# Patient Record
Sex: Female | Born: 1950 | Race: White | Hispanic: No | Smoking: Never smoker
Health system: Southern US, Academic
[De-identification: ages and names within clinical notes are randomized; demographics above are authoritative.]

## PROBLEM LIST (undated history)

## (undated) DIAGNOSIS — M129 Arthropathy, unspecified: Secondary | ICD-10-CM

## (undated) DIAGNOSIS — F0280 Dementia in other diseases classified elsewhere without behavioral disturbance: Secondary | ICD-10-CM

## (undated) DIAGNOSIS — E039 Hypothyroidism, unspecified: Secondary | ICD-10-CM

## (undated) DIAGNOSIS — I10 Essential (primary) hypertension: Secondary | ICD-10-CM

## (undated) DIAGNOSIS — I8390 Asymptomatic varicose veins of unspecified lower extremity: Secondary | ICD-10-CM

## (undated) DIAGNOSIS — E782 Mixed hyperlipidemia: Secondary | ICD-10-CM

## (undated) DIAGNOSIS — G309 Alzheimer's disease, unspecified: Secondary | ICD-10-CM

## (undated) DIAGNOSIS — J449 Chronic obstructive pulmonary disease, unspecified: Secondary | ICD-10-CM

## (undated) HISTORY — PX: BLADDER SURGERY: SHX569

## (undated) HISTORY — PX: HX HYSTERECTOMY: SHX81

## (undated) HISTORY — PX: GALLBLADDER SURGERY: SHX652

## (undated) HISTORY — PX: FOOT SURGERY: SHX648

## (undated) HISTORY — PX: CATARACT EXTRACTION: SUR2

## (undated) HISTORY — DX: Alzheimer's disease, unspecified (CMS HCC): G30.9

## (undated) HISTORY — PX: HX CYST REMOVAL: SHX22

---

## 2018-06-28 ENCOUNTER — Inpatient Hospital Stay (HOSPITAL_COMMUNITY): Payer: Self-pay | Admitting: Internal Medicine

## 2020-09-23 IMAGING — US ABD LIMITED
1 series · 14 of 25 positions shown · non-contrast
Comparison: None.

﻿EXAM: [facility] PROFESSIONAL READ ABD U/S LMTD
INDICATION: D69.6.

[Series 1: abd limited · 14 of 46 slices shown]
[im 1/46]
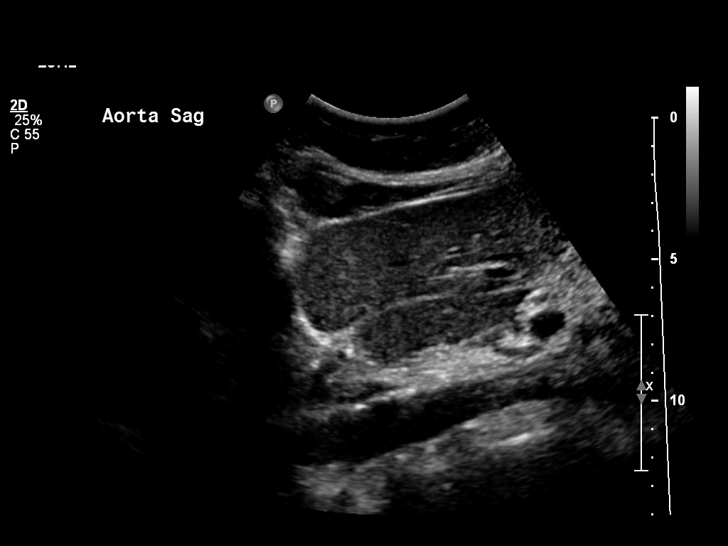
[im 4/46]
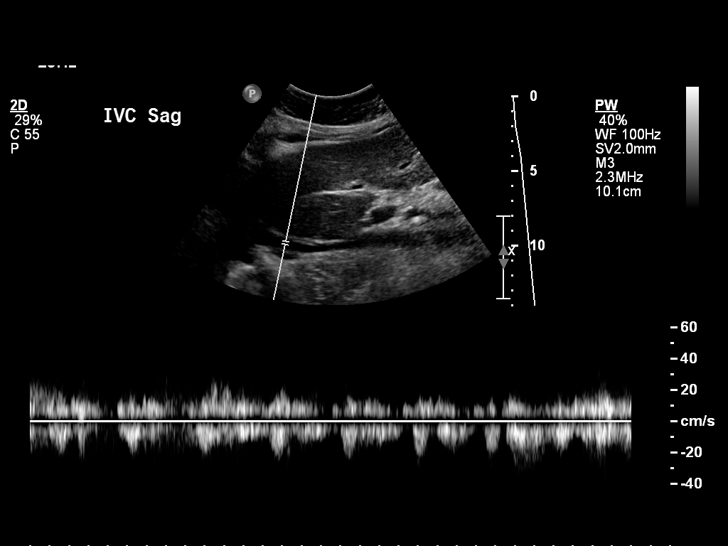
[im 8/46]
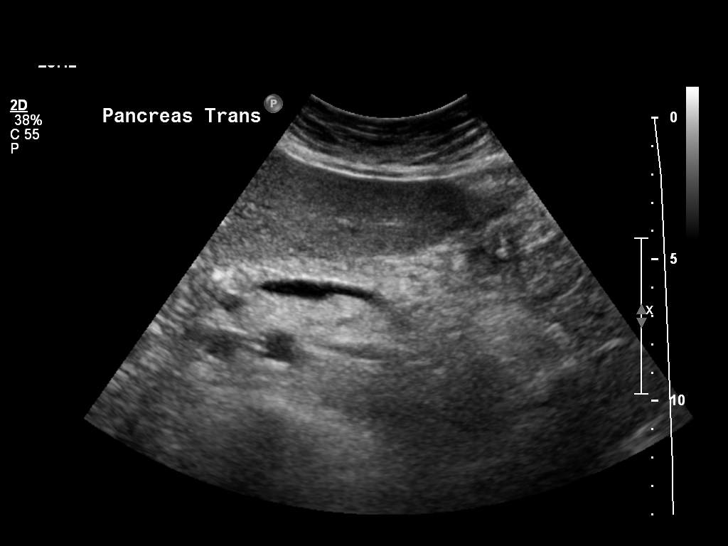
[im 12/46]
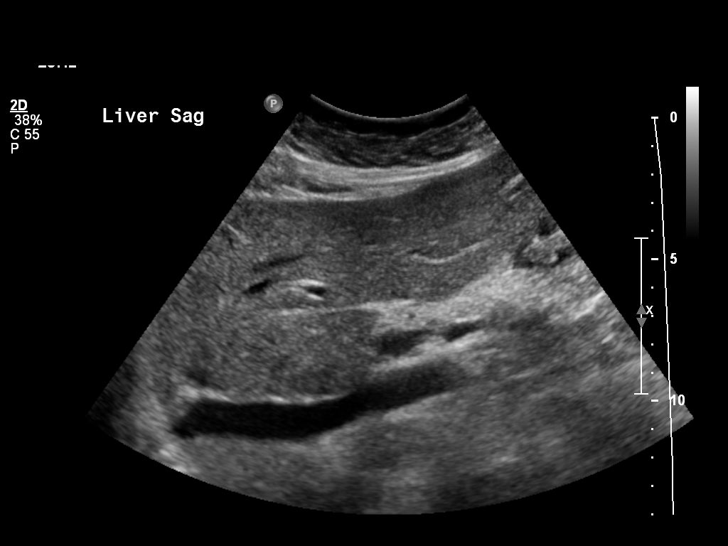
[im 16/46]
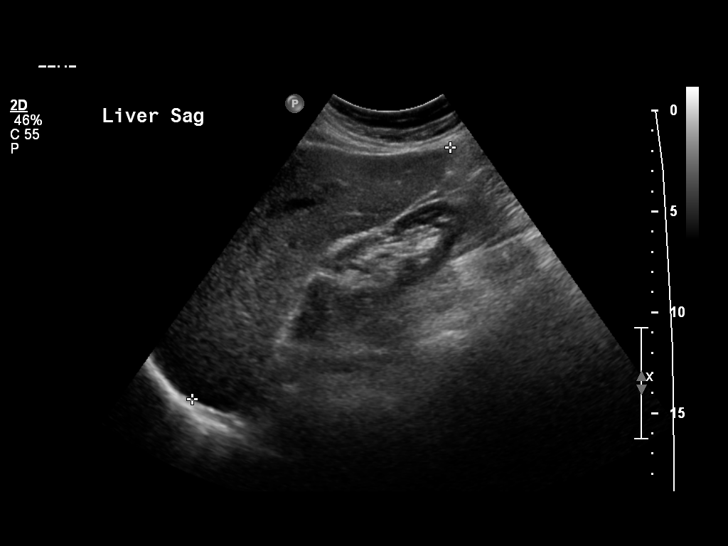
[im 17/46]
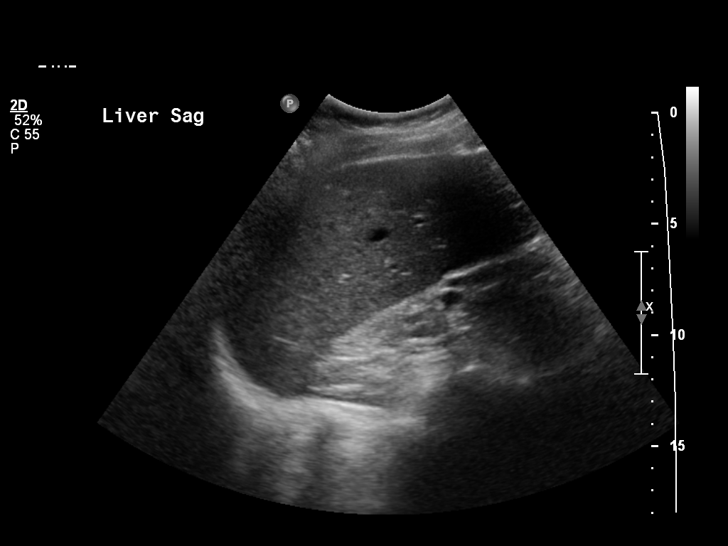
[im 21/46]
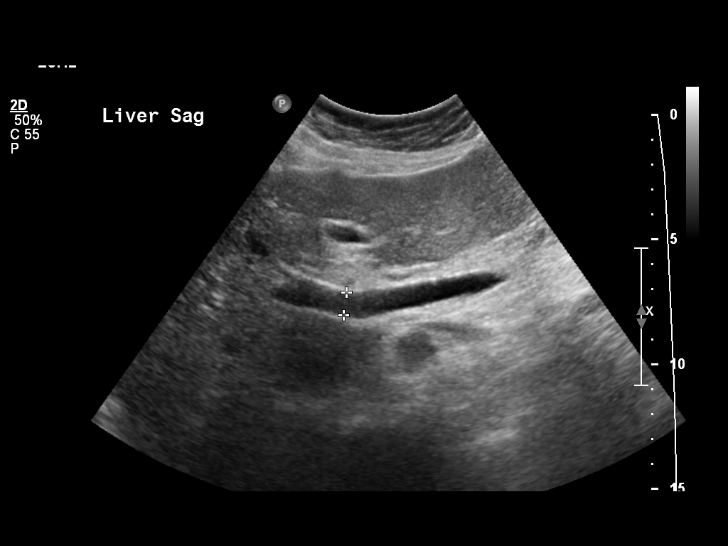
[im 25/46]
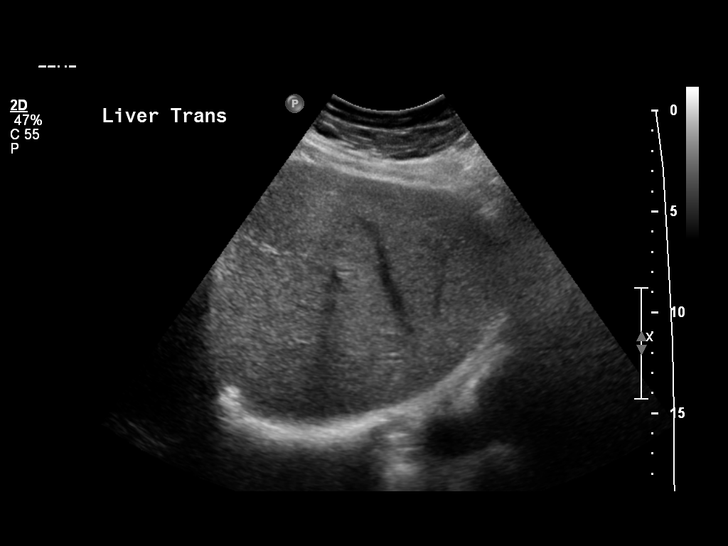
[im 29/46]
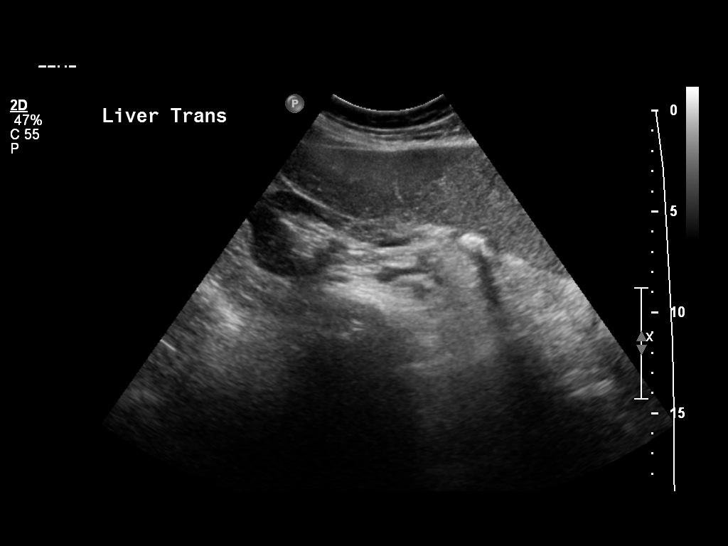
[im 31/46]
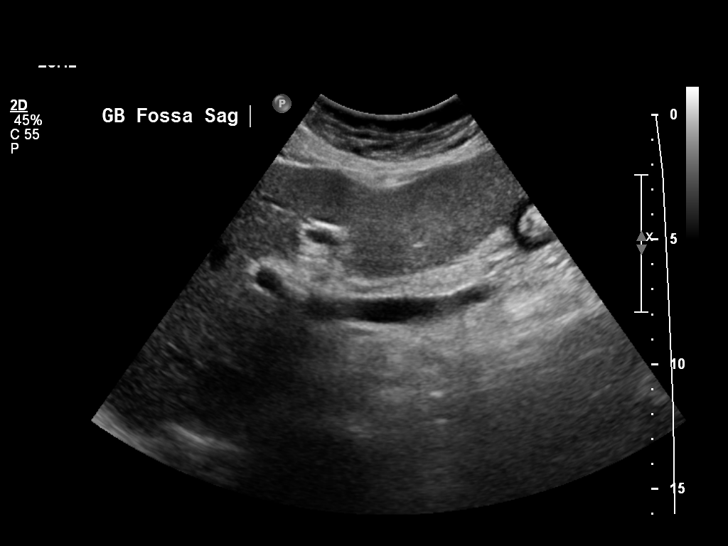
[im 34/46]
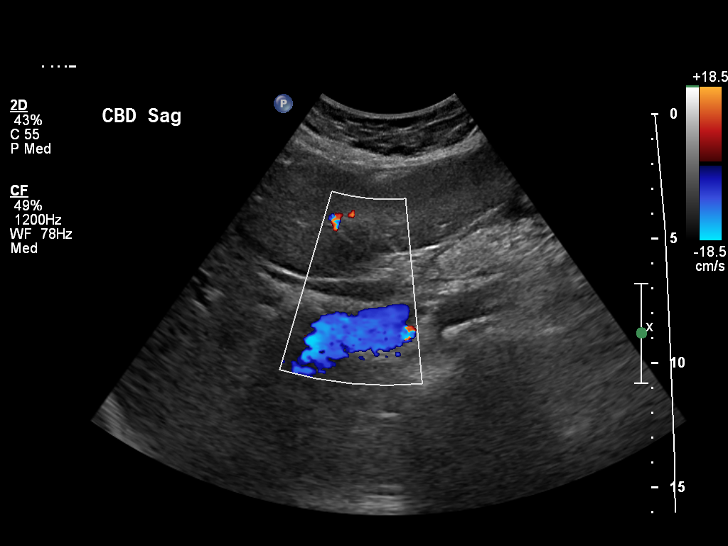
[im 38/46]
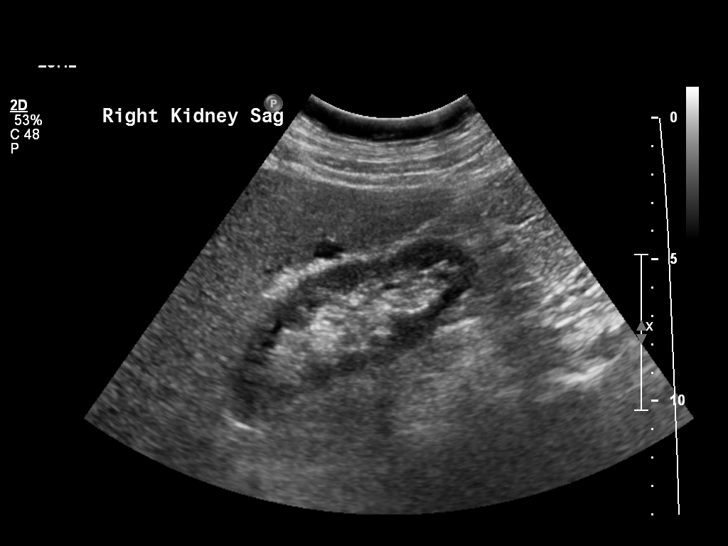
[im 42/46]
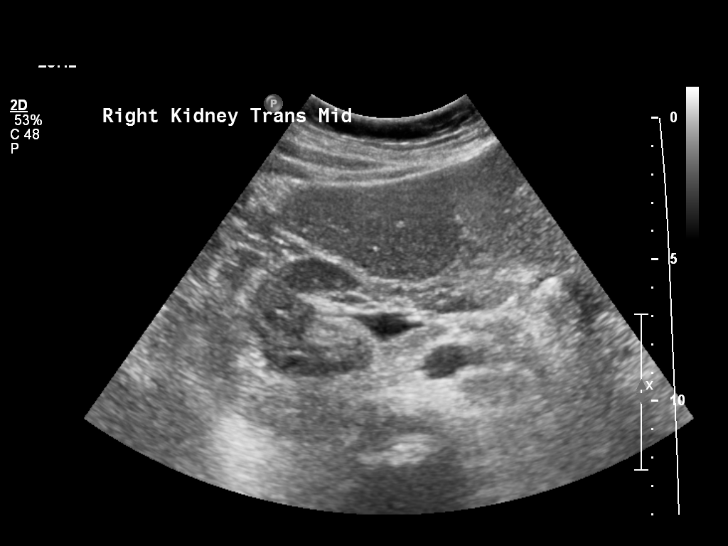
[im 46/46]
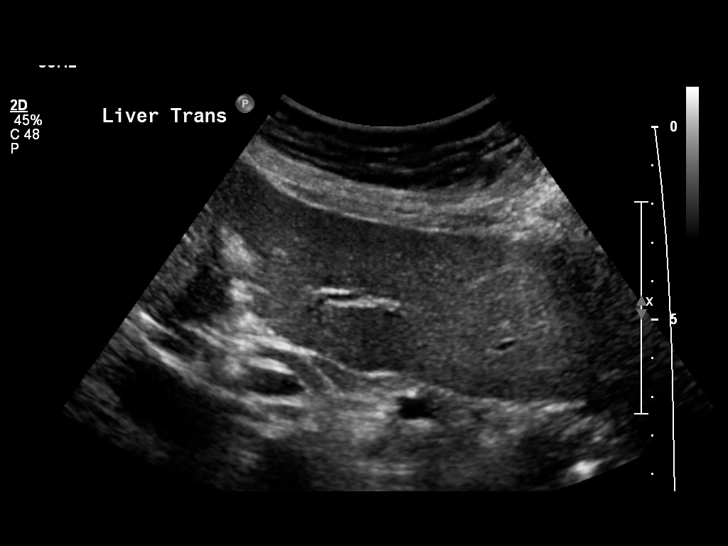

[14 of 25 positions shown; findings below may reference images not displayed]

FINDINGS: Liver is heterogeneous and borderline enlarged measuring 18 cm in maximum sagittal dimension. There is a 10 mm inferior right hepatic lobe cyst. There is no intra or extrahepatic biliary ductal dilation. Common bile duct measures 7 mm. Gallbladder is surgically absent. Pancreas is incompletely visualized due to artifact from overlying bowel gas. Right kidney measures 10 cm and is unremarkable. 

Visualized abdominal aorta is without aneurysmal dilatation. IVC is normal. Portal vein measures 9 mm in diameter and demonstrates appropriate hepatopetal direction of flow. Hepatic veins are also patent. There is no ascites.
IMPRESSION: 1. Heterogeneous and borderline enlarged liver.

2. Prior cholecystectomy.

3. Pancreas incompletely visualized due to artifact from overlying bowel gas.

## 2021-08-13 IMAGING — MR MRI HIP RT W/O CONTRAST
5 of 7 series · 24 of 40 positions shown · IV contrast (gadolinium)
Comparison: None available.

﻿EXAM:  [id number]   MRI HIP RT W/O CONTRAST
INDICATION: Right hip pain.  No known injury.
TECHNIQUE: Multiplanar multisequential MRI of the pelvis and right hip joint was performed without gadolinium contrast.

[Series 13: T2 fat-sat · axial · right · 4.5mm · 0.94mm/px · z∈[-39,+106]mm · 6 of 30 slices shown (1 of 3)]
[im 1/30]
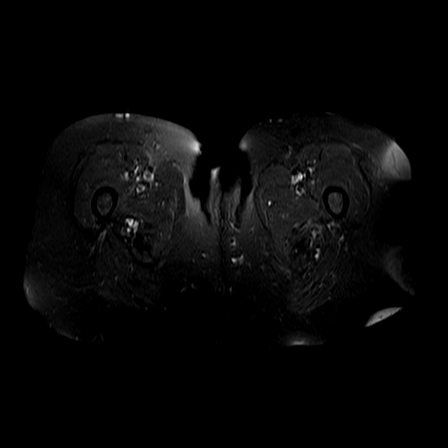
[im 6/30]
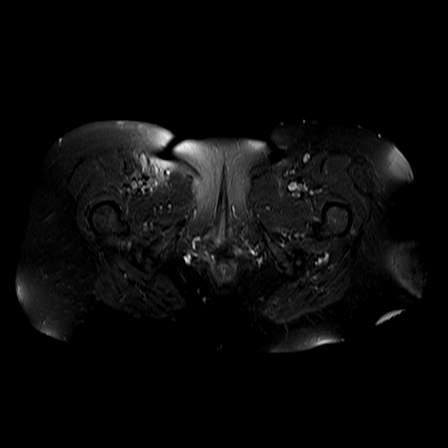
[im 12/30]
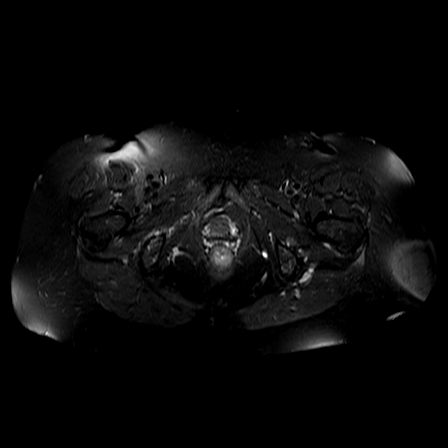
[im 18/30]
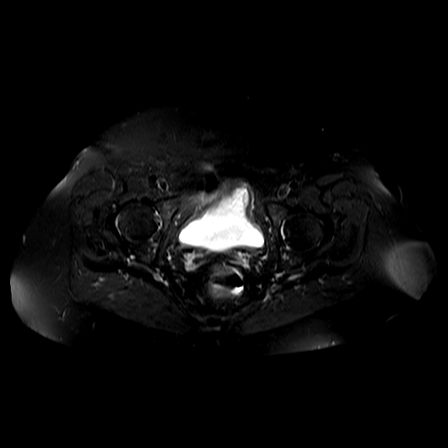
[im 24/30]
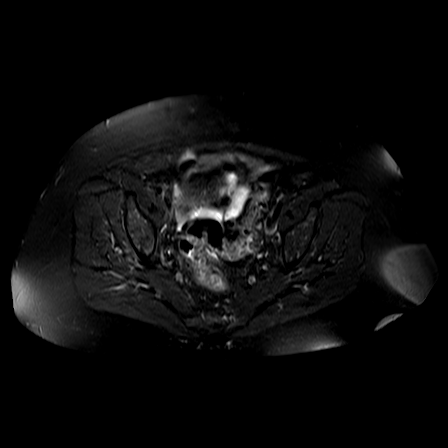
[im 30/30]
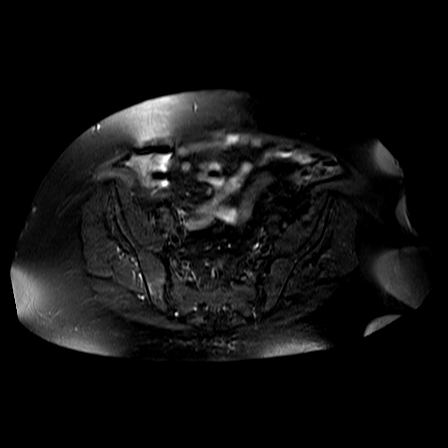

[Series 14: T1 · axial · right · 4.5mm · 0.82mm/px · z∈[-39,+106]mm · 7 of 30 slices shown (1 of 2)]
[im 1/30]
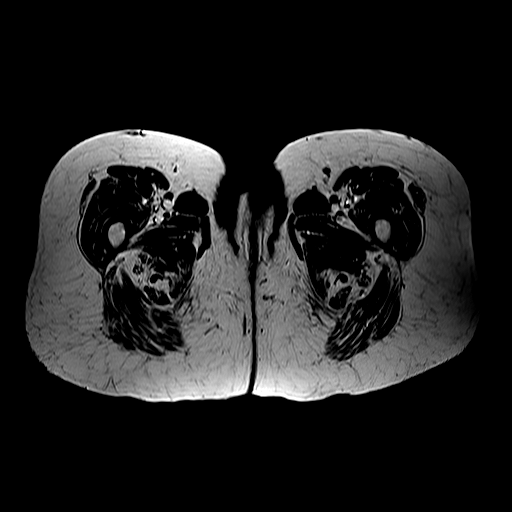
[im 5/30]
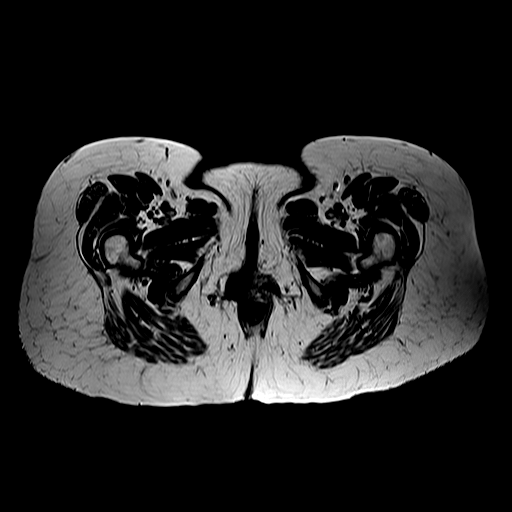
[im 10/30]
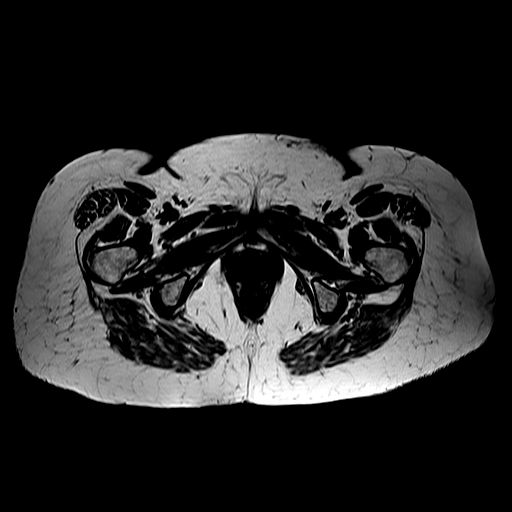
[im 15/30]
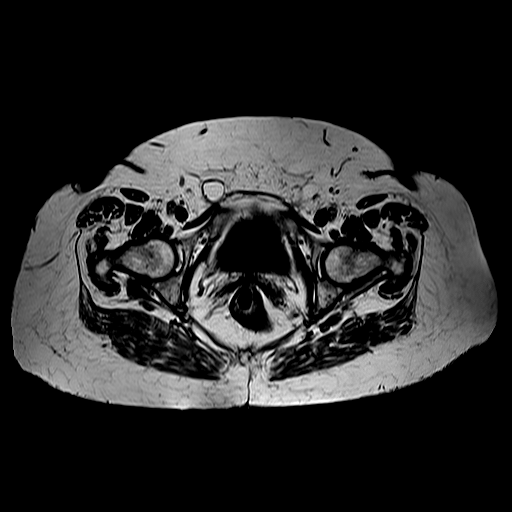
[im 20/30]
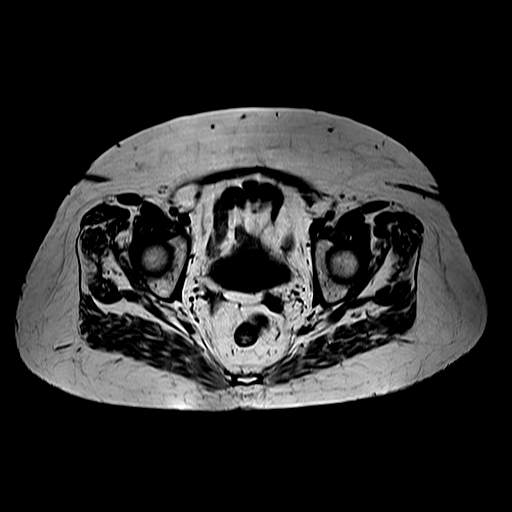
[im 25/30]
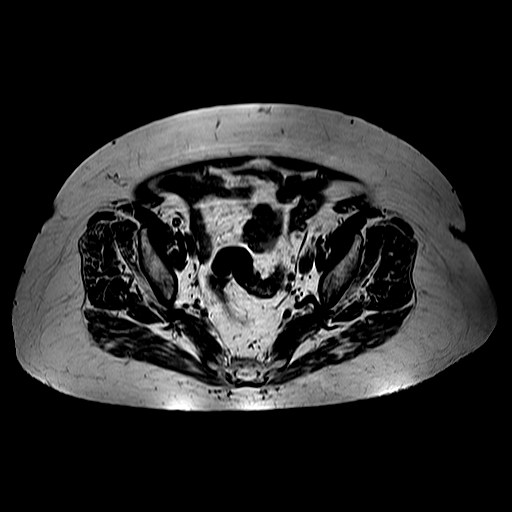
[im 30/30]
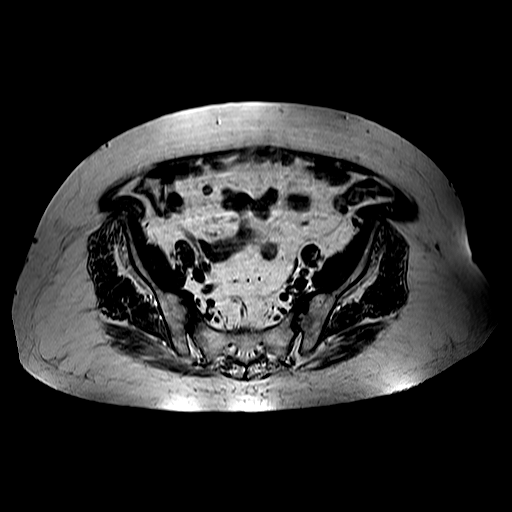

[Series 16: T1 · coronal · right · 4.0mm · 0.86mm/px · 1 of 20 slices shown (2 of 2)]
[im 1/20]
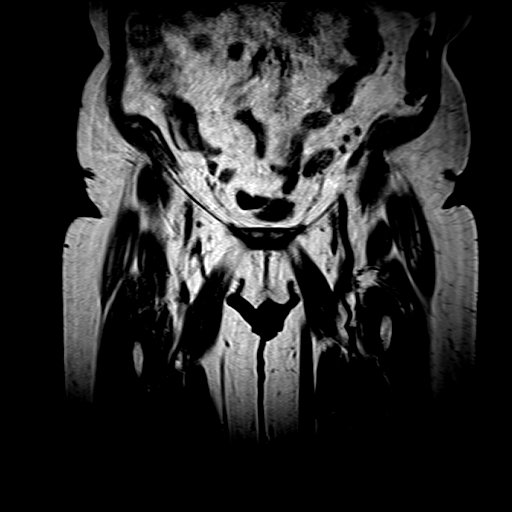

[Series 17: T2 fat-sat · coronal · right · 4.0mm · 0.86mm/px · 5 of 20 slices shown (2 of 3)]
[im 1/20]
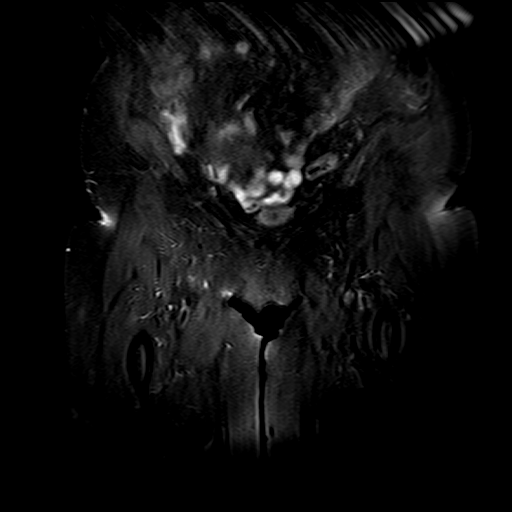
[im 5/20]
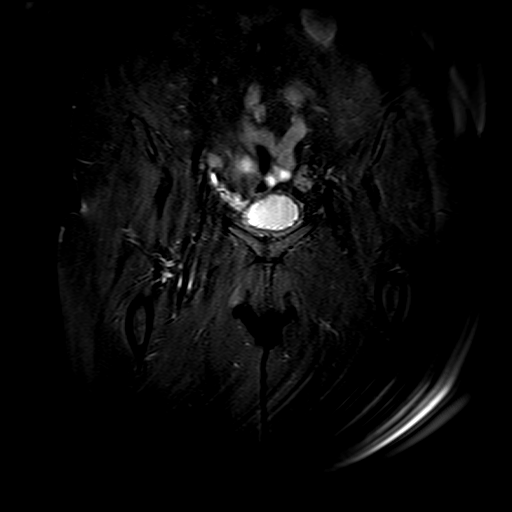
[im 10/20]
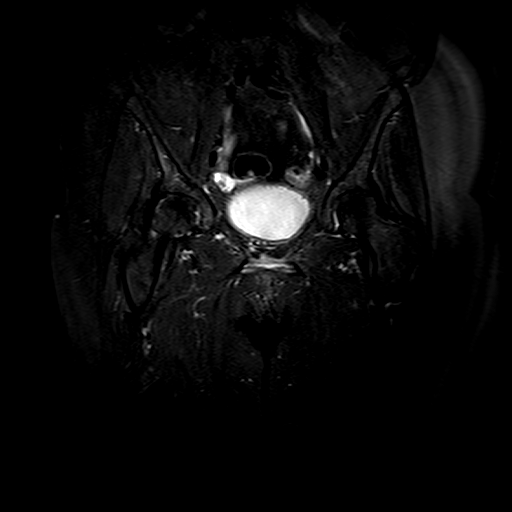
[im 15/20]
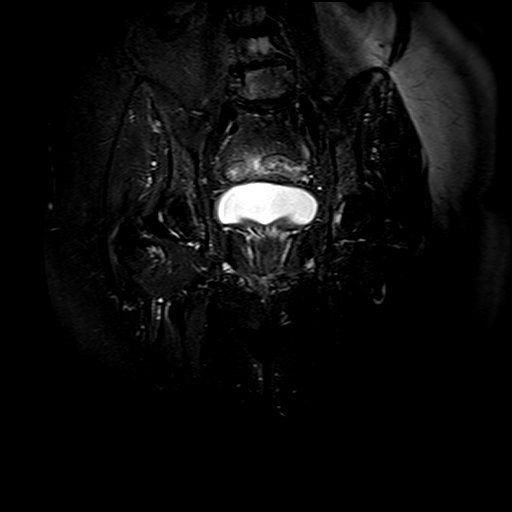
[im 20/20]
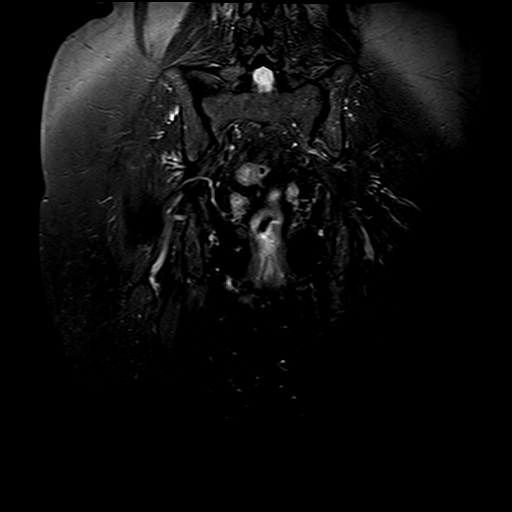

[Series 19: T2 fat-sat · sagittal · right · 4.5mm · 0.78mm/px · 5 of 20 slices shown (3 of 3)]
[im 1/20]
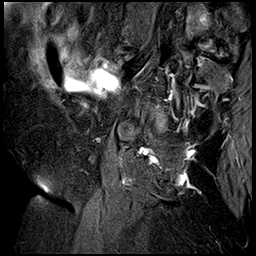
[im 5/20]
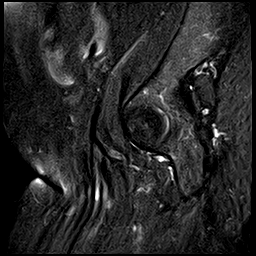
[im 10/20]
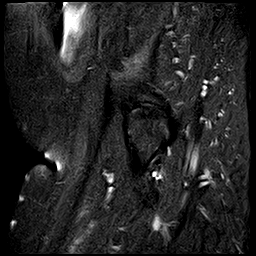
[im 15/20]
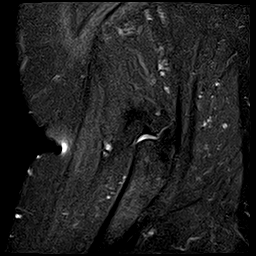
[im 20/20]
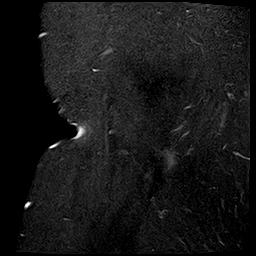

[24 of 40 positions shown; findings below may reference images not displayed]

FINDINGS: Bone marrow signal intensity is normal.  There is no acute fracture or subluxation.  There is mild right hip articular cartilage thinning.  A trace right hip effusion is also seen.  No significant degenerative changes are seen within the femur and acetabulum.  There is also mild right sacroiliac joint osteoarthritis. There is mild left greater trochanteric bursitis.  Muscles and soft tissues about the pelvic girdle appear unremarkable. Evaluation of pelvic parenchymal structures is limited without definite abnormality.
IMPRESSION: 1. Mild right hip and right sacroiliac joint osteoarthritis. 

2. Mild left greater trochanteric bursitis.

## 2021-08-13 IMAGING — MR MRI LUMBAR SPINE WITHOUT AND WITH CONTRAST
9 series · 48 of 48 positions shown · IV contrast (gadavist)
Comparison: None available.

﻿EXAM:  [id number]   MRI LUMBAR SPINE WITHOUT AND WITH CONTRAST
INDICATION: Lower back pain. No history of prior surgery or malignancy.
TECHNIQUE: Multiplanar multisequential MRI of the lumbar spine was performed without and with 5 mL of Gadavist.

[Series 5: T2 · sagittal · 4.0mm · 0.80mm/px · 3 of 13 slices shown (1 of 3)]
[im 1/13]
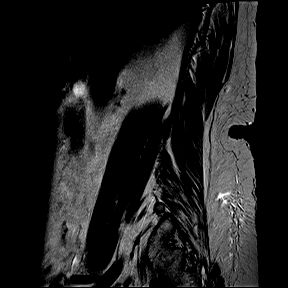
[im 7/13]
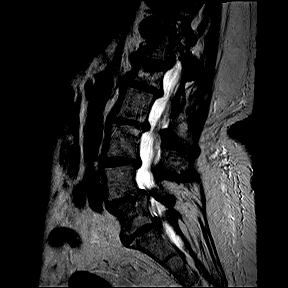
[im 13/13]
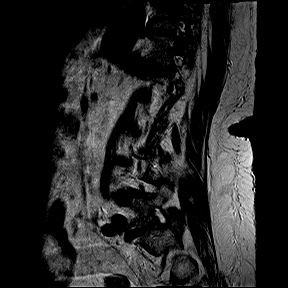

[Series 6: T1 · sagittal · 4.0mm · 0.80mm/px · 3 of 13 slices shown]
[im 1/13]
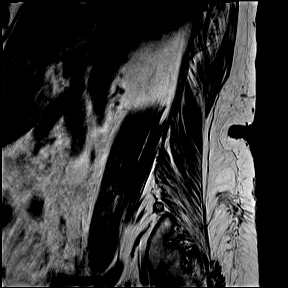
[im 7/13]
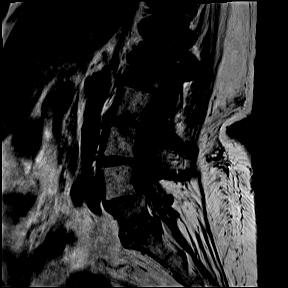
[im 13/13]
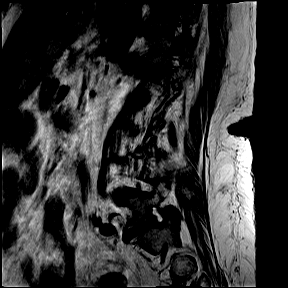

[Series 7: T1 fat-sat · sagittal · 4.0mm · 0.90mm/px · 4 of 13 slices shown (1 of 2)]
[im 1/13]
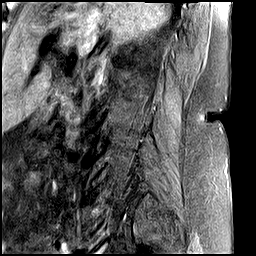
[im 5/13]
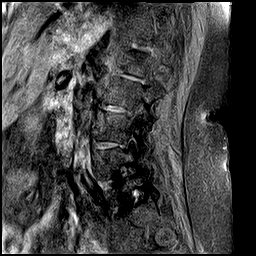
[im 9/13]
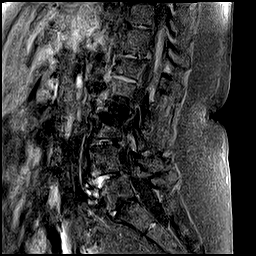
[im 13/13]
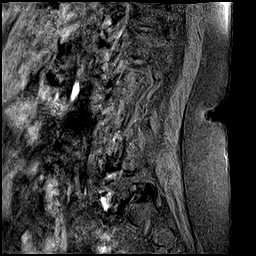

[Series 8: STIR · sagittal · 4.0mm · 0.90mm/px · 4 of 13 slices shown]
[im 1/13]
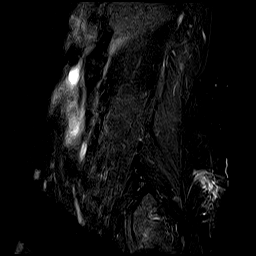
[im 5/13]
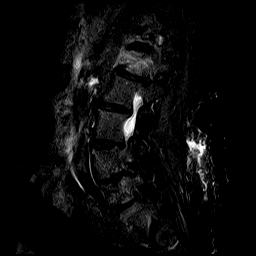
[im 9/13]
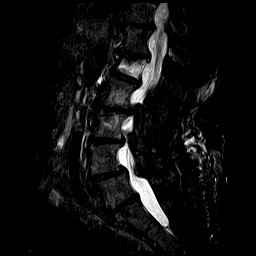
[im 13/13]
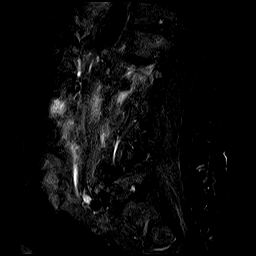

[Series 9: T2 · axial · 4.0mm · 0.47mm/px · z∈[-89,+100]mm · 8 of 26 slices shown (2 of 3)]
[im 1/26]
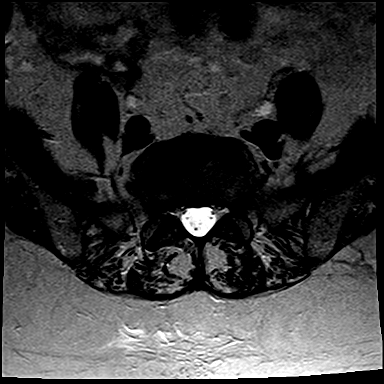
[im 4/26]
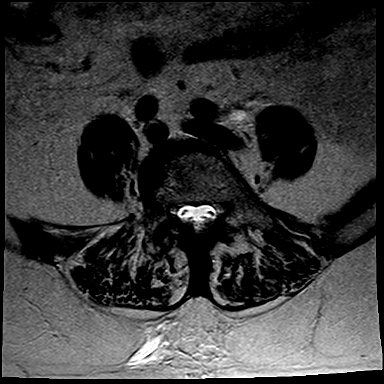
[im 8/26]
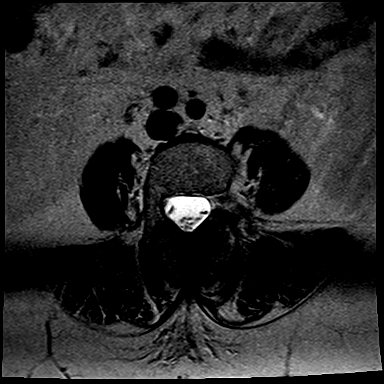
[im 11/26]
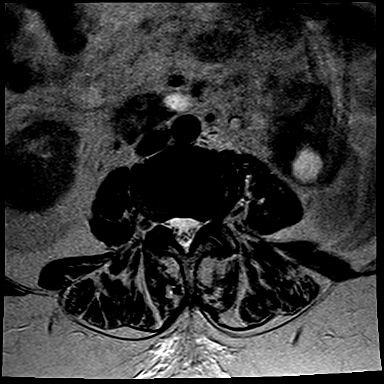
[im 15/26]
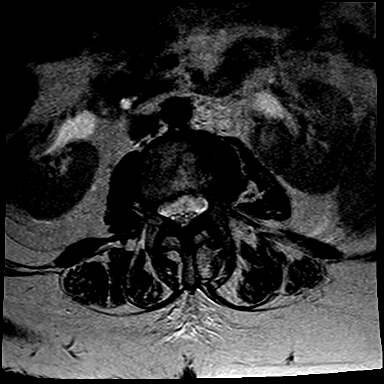
[im 18/26]
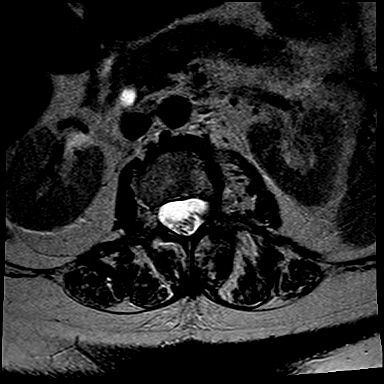
[im 22/26]
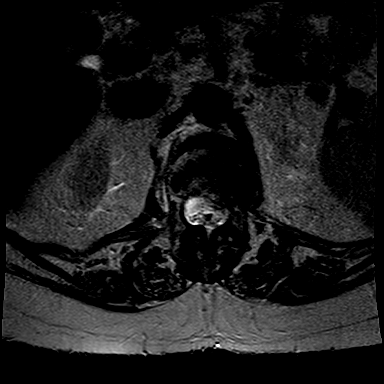
[im 26/26]
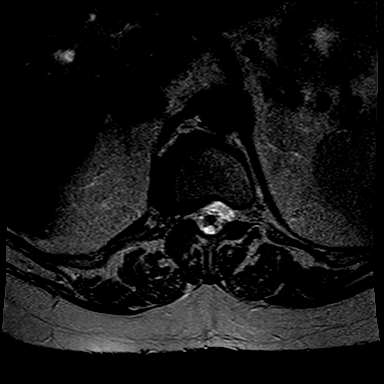

[Series 10: T1 fat-sat · axial · 4.0mm · 0.70mm/px · z∈[-89,+100]mm · 8 of 26 slices shown (2 of 2)]
[im 1/26]
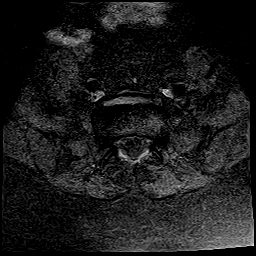
[im 4/26]
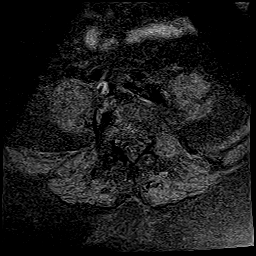
[im 8/26]
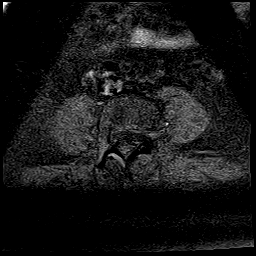
[im 11/26]
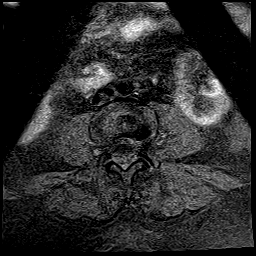
[im 15/26]
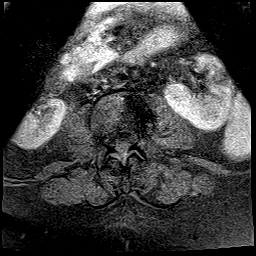
[im 18/26]
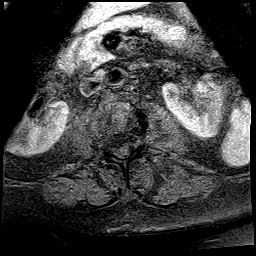
[im 22/26]
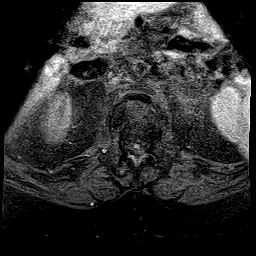
[im 26/26]
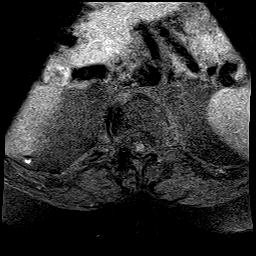

[Series 11: T2 · coronal · 4.0mm · 0.90mm/px · 6 of 20 slices shown (3 of 3)]
[im 1/20]
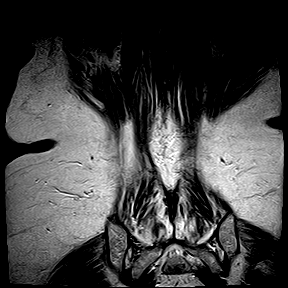
[im 4/20]
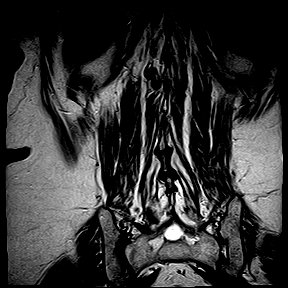
[im 8/20]
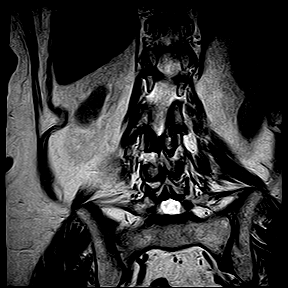
[im 12/20]
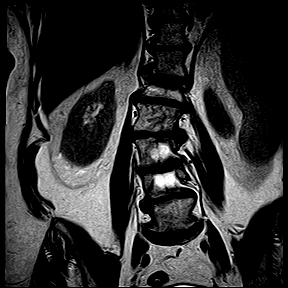
[im 16/20]
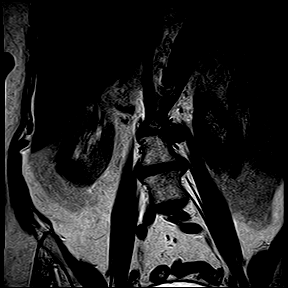
[im 20/20]
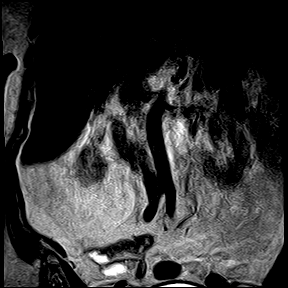

[Series 12: T1 fat-sat post-contrast · sagittal · 4.0mm · 0.90mm/px · 4 of 13 slices shown (1 of 2)]
[im 1/13]
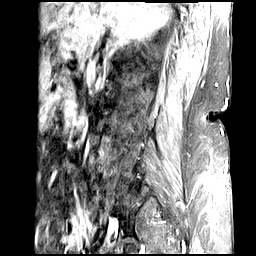
[im 5/13]
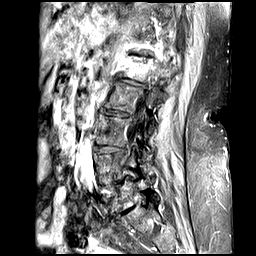
[im 9/13]
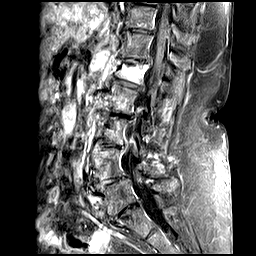
[im 13/13]
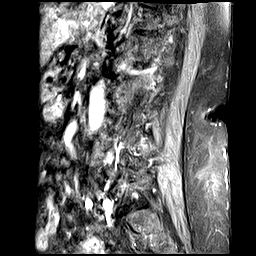

[Series 13: T1 fat-sat post-contrast · axial · 4.0mm · 0.70mm/px · z∈[-89,+100]mm · 8 of 26 slices shown (2 of 2)]
[im 1/26]
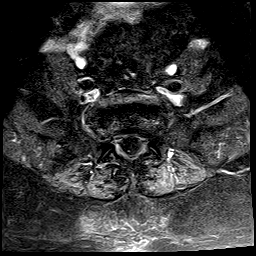
[im 4/26]
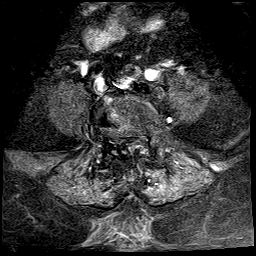
[im 8/26]
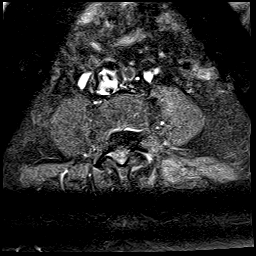
[im 11/26]
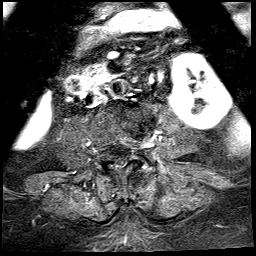
[im 15/26]
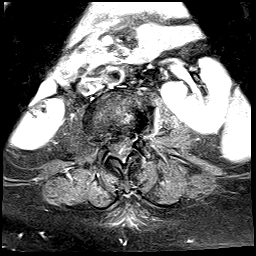
[im 18/26]
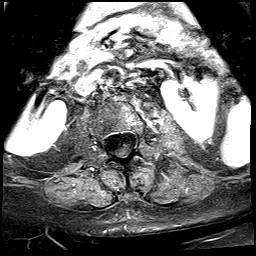
[im 22/26]
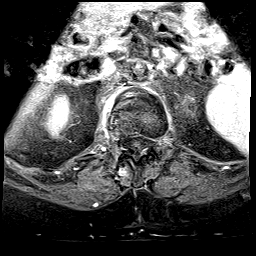
[im 26/26]
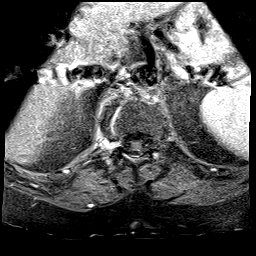

[48 of 48 positions shown; findings below may reference images not displayed]

FINDINGS: There is a mild dextroscoliosis centered at L2-3 disc space level.  There is also a mild subacute L1 superior endplate compression fracture. Distal spinal cord is normal in signal intensity and terminates normally at T12-L1 disc space level. Spinal canal is congenitally narrow.

At T12-L1 level, there is a small broad-based central disc bulge mildly effacing the ventral thecal sac. There is moderate right neural foraminal stenosis from facet arthropathy and bulging annulus. 

At L1-2 level, there is a small broad-based central disc bulge mildly effacing the ventral thecal sac. There is moderate left and mild right neural foraminal stenosis from facet arthropathy and bulging annulus.

At L2-3 level, there is a small broad-based central and left paracentral disc bulge resulting in moderate spinal and severe left lateral recess compression. There is severe left neural foraminal stenosis from facet arthropathy and bulging annulus. 

At L3-4 level, there is a small broad-based central disc bulge mildly effacing the ventral thecal sac. There is moderate to severe left and mild-to-moderate right neural foraminal stenosis from facet arthropathy and bulging annulus. 

At L4-5 level, there is grade 1 anterolisthesis of L4 on L5 vertebral body. There is a small broad-based central disc bulge resulting in severe spinal stenosis. There is moderate bilateral neural foraminal stenosis from facet arthropathy and bulging annulus. 

At L5-S1 level, there is marked disc desiccation. There is a small broad-based central disc bulge mildly effacing the ventral thecal sac. There is moderate to severe right and mild-to-moderate left neural foraminal stenosis from facet arthropathy and bulging annulus. 

Paraspinal soft tissues are unremarkable.  Following intravenous contrast administration, there is no abnormal nerve root or paraspinal soft tissue enhancement.
IMPRESSION: 1. Mild subacute L1 superior endplate compression fracture.  

2. Mild dextroscoliosis centered at L2-3 disc space level and grade 1 anterolisthesis of L4 on L5 vertebral body.  

3. Severe left lateral recess and neural foraminal stenosis at L2-3 level from a small central and left paracentral disc bulge.  

4. Severe spinal stenosis at L4-5 level from a small central disc bulge.  

5. Multilevel neural foraminal stenosis as detailed above.

## 2021-09-11 ENCOUNTER — Other Ambulatory Visit (HOSPITAL_COMMUNITY): Payer: Self-pay | Admitting: Family Medicine

## 2021-09-11 DIAGNOSIS — S72001A Fracture of unspecified part of neck of right femur, initial encounter for closed fracture: Secondary | ICD-10-CM

## 2021-09-11 DIAGNOSIS — S32009A Unspecified fracture of unspecified lumbar vertebra, initial encounter for closed fracture: Secondary | ICD-10-CM

## 2021-09-15 ENCOUNTER — Other Ambulatory Visit: Payer: Self-pay

## 2021-09-15 ENCOUNTER — Inpatient Hospital Stay
Admission: RE | Admit: 2021-09-15 | Discharge: 2021-09-15 | Disposition: A | Discharge status: Home / Self Care | Payer: Medicare Other | Source: Ambulatory Visit | Attending: Family Medicine | Admitting: Family Medicine

## 2021-09-15 DIAGNOSIS — S72001A Fracture of unspecified part of neck of right femur, initial encounter for closed fracture: Secondary | ICD-10-CM | POA: Insufficient documentation

## 2021-09-15 DIAGNOSIS — M1611 Unilateral primary osteoarthritis, right hip: Secondary | ICD-10-CM

## 2021-09-15 DIAGNOSIS — S32009A Unspecified fracture of unspecified lumbar vertebra, initial encounter for closed fracture: Secondary | ICD-10-CM | POA: Insufficient documentation

## 2021-09-15 MED ORDER — IOHEXOL 300 MG IODINE/ML INTRAVENOUS SOLUTION
5.0000 mL | INTRAVENOUS | Status: AC
Start: 2021-09-15 — End: 2021-09-15
  Administered 2021-09-15: 5 mL via INTRA_ARTICULAR

## 2021-09-15 MED ORDER — LIDOCAINE (PF) 20 MG/ML (2 %) INJECTION SOLUTION
INTRAMUSCULAR | Status: AC
Start: 2021-09-15 — End: 2021-09-15
  Filled 2021-09-15: qty 10

## 2021-09-15 MED ORDER — TRIAMCINOLONE ACETONIDE 40 MG/ML SUSPENSION FOR INJECTION
INTRAMUSCULAR | Status: AC
Start: 2021-09-15 — End: 2021-09-15
  Filled 2021-09-15: qty 2

## 2021-09-15 MED ORDER — BUPIVACAINE (PF) 0.25 % (2.5 MG/ML) INJECTION SOLUTION
INTRAMUSCULAR | Status: AC
Start: 2021-09-15 — End: 2021-09-15
  Filled 2021-09-15: qty 10

## 2022-01-24 ENCOUNTER — Emergency Department (HOSPITAL_COMMUNITY): Payer: Medicare Other

## 2022-01-24 ENCOUNTER — Other Ambulatory Visit: Payer: Self-pay

## 2022-01-24 ENCOUNTER — Encounter (HOSPITAL_COMMUNITY): Payer: Self-pay

## 2022-01-24 ENCOUNTER — Emergency Department
Admission: EM | Admit: 2022-01-24 | Discharge: 2022-01-24 | Disposition: A | Discharge status: Home / Self Care | Payer: Medicare Other | Attending: Student in an Organized Health Care Education/Training Program | Admitting: Student in an Organized Health Care Education/Training Program

## 2022-01-24 DIAGNOSIS — R9431 Abnormal electrocardiogram [ECG] [EKG]: Secondary | ICD-10-CM | POA: Insufficient documentation

## 2022-01-24 DIAGNOSIS — R42 Dizziness and giddiness: Secondary | ICD-10-CM

## 2022-01-24 HISTORY — DX: Chronic obstructive pulmonary disease, unspecified: J44.9

## 2022-01-24 HISTORY — DX: Mixed hyperlipidemia: E78.2

## 2022-01-24 HISTORY — DX: Arthropathy, unspecified: M12.9

## 2022-01-24 HISTORY — DX: Essential (primary) hypertension: I10

## 2022-01-24 HISTORY — DX: Asymptomatic varicose veins of unspecified lower extremity: I83.90

## 2022-01-24 HISTORY — DX: Hypothyroidism, unspecified: E03.9

## 2022-01-24 HISTORY — DX: Dementia in other diseases classified elsewhere, unspecified severity, without behavioral disturbance, psychotic disturbance, mood disturbance, and anxiety: F02.80

## 2022-01-24 LAB — ECG 12 LEAD
Atrial Rate: 77 {beats}/min
Calculated P Axis: 65 degrees
Calculated R Axis: 75 degrees
PR Interval: 168 ms
QRS Duration: 72 ms
QTC Calculation: 452 ms
Ventricular rate: 77 {beats}/min

## 2022-01-24 LAB — TROPONIN-I
TROPONIN I: 5 ng/L (ref <15)
TROPONIN I: 5 ng/L (ref <15)
TROPONIN I: 5 ng/L (ref <15)

## 2022-01-24 LAB — CBC WITH DIFF
BASOPHIL #: 0 10*3/uL (ref 0.00–0.30)
BASOPHIL %: 1 % (ref 0–3)
EOSINOPHIL #: 0.1 10*3/uL (ref 0.00–0.80)
EOSINOPHIL %: 2 % (ref 0–7)
HCT: 40.5 % (ref 37.0–47.0)
HGB: 13.8 g/dL (ref 12.5–16.0)
LYMPHOCYTE #: 1.3 10*3/uL (ref 1.10–5.00)
LYMPHOCYTE %: 20 % — ABNORMAL LOW (ref 25–45)
MCH: 31.2 pg (ref 27.0–32.0)
MCHC: 34 g/dL (ref 32.0–36.0)
MCV: 91.7 fL (ref 78.0–99.0)
MONOCYTE #: 0.5 10*3/uL (ref 0.00–1.30)
MONOCYTE %: 7 % (ref 0–12)
MPV: 6.8 fL — ABNORMAL LOW (ref 7.4–10.4)
NEUTROPHIL #: 4.7 10*3/uL (ref 1.80–8.40)
NEUTROPHIL %: 70 % (ref 40–76)
PLATELETS: 142 10*3/uL (ref 140–440)
RBC: 4.42 10*6/uL (ref 4.20–5.40)
RDW: 14.6 % (ref 11.6–14.8)
WBC: 6.7 10*3/uL (ref 4.0–10.5)
WBCS UNCORRECTED: 6.7 10*3/uL

## 2022-01-24 LAB — URINALYSIS, MACROSCOPIC
BILIRUBIN: NEGATIVE mg/dL
BLOOD: NEGATIVE mg/dL
GLUCOSE: NEGATIVE mg/dL
KETONES: NEGATIVE mg/dL
LEUKOCYTES: NEGATIVE WBCs/uL
NITRITE: NEGATIVE
PH: 5.5 (ref 5.0–9.0)
PROTEIN: NEGATIVE mg/dL
SPECIFIC GRAVITY: 1.005 (ref 1.002–1.030)
UROBILINOGEN: NORMAL mg/dL

## 2022-01-24 LAB — PT/INR
INR: 1.03 (ref <=5.00)
PROTHROMBIN TIME: 11.9 seconds (ref 9.8–12.7)

## 2022-01-24 LAB — COMPREHENSIVE METABOLIC PANEL, NON-FASTING
ALBUMIN/GLOBULIN RATIO: 1.3 (ref 0.8–1.4)
ALBUMIN: 4.5 g/dL (ref 3.5–5.7)
ALKALINE PHOSPHATASE: 76 U/L (ref 34–104)
ALT (SGPT): 17 U/L (ref 7–52)
ANION GAP: 6 mmol/L (ref 4–13)
AST (SGOT): 25 U/L (ref 13–39)
BILIRUBIN TOTAL: 0.7 mg/dL (ref 0.3–1.2)
BUN/CREA RATIO: 11 (ref 6–22)
BUN: 9 mg/dL (ref 7–25)
CALCIUM, CORRECTED: 9.2 mg/dL (ref 8.9–10.8)
CALCIUM: 9.7 mg/dL (ref 8.6–10.3)
CHLORIDE: 104 mmol/L (ref 98–107)
CO2 TOTAL: 30 mmol/L (ref 21–31)
CREATININE: 0.81 mg/dL (ref 0.60–1.30)
ESTIMATED GFR: 77 mL/min/{1.73_m2} (ref >59)
GLOBULIN: 3.5 (ref 2.9–5.4)
GLUCOSE: 98 mg/dL (ref 74–109)
OSMOLALITY, CALCULATED: 278 mOsm/kg (ref 270–290)
POTASSIUM: 3.8 mmol/L (ref 3.5–5.1)
PROTEIN TOTAL: 8 g/dL (ref 6.4–8.9)
SODIUM: 140 mmol/L (ref 136–145)

## 2022-01-24 LAB — URINALYSIS, MICROSCOPIC
RBCS: <1 /hpf (ref <4)
WBCS: <1 /hpf (ref <6)

## 2022-01-24 LAB — MAGNESIUM: MAGNESIUM: 1.8 mg/dL — ABNORMAL LOW (ref 1.9–2.7)

## 2022-01-24 LAB — GRAY TOP TUBE

## 2022-01-24 LAB — PTT (PARTIAL THROMBOPLASTIN TIME): APTT: 31.2 seconds (ref 26.0–36.0)

## 2022-01-24 MED ORDER — MECLIZINE 12.5 MG TABLET
12.5000 mg | ORAL_TABLET | Freq: Two times a day (BID) | ORAL | 0 refills | Status: AC | PRN
Start: 2022-01-24 — End: 2022-02-03

## 2022-01-24 MED ORDER — HYDRALAZINE 20 MG/ML INJECTION SOLUTION
INTRAMUSCULAR | Status: AC
Start: 2022-01-24 — End: 2022-01-24
  Filled 2022-01-24: qty 1

## 2022-01-24 MED ORDER — HYDRALAZINE 20 MG/ML INJECTION SOLUTION
10.0000 mg | INTRAMUSCULAR | Status: AC
Start: 2022-01-24 — End: 2022-01-24
  Administered 2022-01-24: 10 mg via INTRAVENOUS

## 2022-01-24 MED ORDER — LACTATED RINGERS IV BOLUS
1000.0000 mL | INJECTION | Status: AC
Start: 2022-01-24 — End: 2022-01-24
  Administered 2022-01-24: 0 mL via INTRAVENOUS
  Administered 2022-01-24: 1000 mL via INTRAVENOUS

## 2022-01-24 MED ORDER — MECLIZINE 25 MG TABLET
25.0000 mg | ORAL_TABLET | ORAL | Status: AC
Start: 2022-01-24 — End: 2022-01-24
  Administered 2022-01-24: 25 mg via ORAL

## 2022-01-24 MED ORDER — MECLIZINE 25 MG TABLET
ORAL_TABLET | ORAL | Status: AC
Start: 2022-01-24 — End: 2022-01-24
  Filled 2022-01-24: qty 1

## 2022-01-24 NOTE — ED Nurses Note (Signed)
Patient discharged to home. A written copy of discharge instructions was given to the patient. Questions sufficiently answered as needed. Patient encouraged to follow up with PCP as indicated. IV removed, catheter intact. Pressure dressing applied and bleeding controlled. Patient left department via wheelchair.

## 2022-01-24 NOTE — Discharge Instructions (Signed)
Resume home medications as previously prescribed.  Take meclizine as directed.  Follow up with your PCP for close ED follow-up.  Tried to consume 64 oz of fluids per day.  Return emergency department for new or worsening symptoms that concern you.    Do keep a track record of your blood pressure if uncontrolled follow up with your PCP for further medication management.

## 2022-01-24 NOTE — ED Provider Notes (Signed)
[facility]  ED Primary Provider Note  History of Present Illness   Chief Complaint   Patient presents with    Dizziness     Taylor Levine is a 71 y.o. female who had concerns including Dizziness.  Arrival: The patient arrived by Car    Patient is a 71 year old arrives today via POV with Taylor Levine at bedside whom the patient lives with complaining of headache and dizziness.  Patient was by [facility] for CT of the head.  Patient reports she is had intermittent dizziness.  Patient reports dizziness is worsened with sudden movements.  Taylor Levine states that patient has had uncontrolled blood pressure for last few days.  Patient does endorse frontal headache but states it is similar to previous headaches.  Patient denies recent traumatic events, neuro focal deficits, vision changes, epistaxis, hemiparesis, slurred speech, chest pain, shortness of breath, abdominal pain    Review of Systems   Pertinent positive and negative ROS as per HPI.  Historical Data   History Reviewed This Encounter:      Physical Exam   ED Triage Vitals [01/24/22 1117]   BP (Non-Invasive) (!) 174/77   Heart Rate 76   Respiratory Rate 16   Temperature 36.1 C (96.9 F)   SpO2 95 %   Weight 69.4 kg (153 lb)   Height 1.549 m (_0 )     Physical Exam  Vitals and nursing note reviewed.   Constitutional:       Appearance: Normal appearance. She is normal weight.   HENT:      Head: Normocephalic and atraumatic.      Mouth/Throat:      Mouth: Mucous membranes are moist.   Eyes:      Pupils: Pupils are equal, round, and reactive to light.   Cardiovascular:      Rate and Rhythm: Normal rate and regular rhythm.      Pulses: Normal pulses.      Heart sounds: Normal heart sounds.   Pulmonary:      Effort: Pulmonary effort is normal.      Breath sounds: Normal breath sounds.   Abdominal:      General: Abdomen is flat. Bowel sounds are normal.      Palpations: Abdomen is soft.   Musculoskeletal:         General: Normal range  of motion.   Skin:     General: Skin is warm and dry.      Capillary Refill: Capillary refill takes less than 2 seconds.   Neurological:      General: No focal deficit present.      Mental Status: She is alert and oriented to person, place, and time. Mental status is at baseline.      GCS: GCS eye subscore is 4. GCS verbal subscore is 5. GCS motor subscore is 6.      Motor: Motor function is intact.      Coordination: Coordination is intact. Coordination normal. Finger-Nose-Finger Test normal.      Gait: Gait is intact.   Psychiatric:         Mood and Affect: Mood normal.         Behavior: Behavior normal.         Thought Content: Thought content normal.         Judgment: Judgment normal.     Patient Data     Labs Ordered/Reviewed   MAGNESIUM - Abnormal; Notable for the following components:  Result Value    MAGNESIUM 1.8 (*)     All other components within normal limits   CBC WITH DIFF - Abnormal; Notable for the following components:    MPV 6.8 (*)     LYMPHOCYTE % 20 (*)     All other components within normal limits   URINALYSIS, MICROSCOPIC - Abnormal; Notable for the following components:    MUCOUS Rare (*)     All other components within normal limits   COMPREHENSIVE METABOLIC PANEL, NON-FASTING - Normal    Narrative:     Estimated Glomerular Filtration Rate (eGFR) is calculated using the CKD-EPI (2021) equation, intended for patients 73 years of age and older. If gender is not documented or "unknown", there will be no eGFR calculation.   PT/INR - Normal    Narrative:     INR OF 2.0-3.0  RECOMMENDED FOR: PROPHYLAXIS/TREATMENT OF VENEOUS THROMBOSIS, PULMONARY EMBOLISM, PREVENTION OF SYSTEMIC EMBOLISM FROM ATRIAL FIBRILATION, MYOCARDIAL INFARCTION.    INR OF 2.5-3.5  RECOMMENDED FOR MECHANICAL PROSTHETIC HEART VALVES, RECURRENT SYSTEMIC EMBOLISM, RECURRENT MYOCARDIAL INFARCTION.     PTT (PARTIAL THROMBOPLASTIN TIME) - Normal   TROPONIN-I - Normal   URINALYSIS, MACROSCOPIC - Normal   TROPONIN-I - Normal    TROPONIN-I - Normal   CBC/DIFF    Narrative:     The following orders were created for panel order CBC/DIFF.  Procedure                               Abnormality         Status                     ---------                               -----------         ------                     CBC WITH CWCB[[id number]]                Abnormal            Final result                 Please view results for these tests on the individual orders.   URINALYSIS, MACROSCOPIC AND MICROSCOPIC W/CULTURE REFLEX    Narrative:     The following orders were created for panel order URINALYSIS, MACROSCOPIC AND MICROSCOPIC W/CULTURE REFLEX.  Procedure                               Abnormality         Status                     ---------                               -----------         ------                     URINALYSIS, MACROSCOPIC[[id number]]      Normal              Final result  URINALYSIS, MICROSCOPIC[[id number]]      Abnormal            Final result                 Please view results for these tests on the individual orders.     CT BRAIN WO IV CONTRAST   Final Result by Edi, [facility] In (08/20 1345)   CHRONIC CHANGES.  NO ACUTE FINDINGS.          One or more dose reduction techniques were used (e.g., Automated exposure control, adjustment of the mA and/or kV according to patient size, use of iterative reconstruction technique).         Radiologist location ID: [facility] Decision Making        Medical Decision Making  CT head unremarkable for any acute changes or concerns.  Orthostatic vital signs negative.  Workup grossly normal.  EKG showed normal sinus rhythm rate of 77 beats per minute PR interval 168 QTC 452.  No ST elevation or depression noted.  Patient did receive 1 L of fluids as well as meclizine states he does feel better at this time.  Patient was given hydralazine for her blood pressure.  Blood pressure stable upon discharge.  Patient given prescription for meclizine advised to follow up  with PCP for close ED follow-up.  All questions and concerns addressed at this time.    Amount and/or Complexity of Data Reviewed  Labs: ordered.  Radiology: ordered.  ECG/medicine tests: ordered.    Risk  Prescription drug management.             Medications Administered in the ED   LR bolus infusion 1,000 mL (0 mL Intravenous Stopped 01/24/22 1842)   meclizine (ANTIVERT) tablet (25 mg Oral Given 01/24/22 1756)   hydrALAZINE (APRESOLINE) injection 10 mg (10 mg Intravenous Given 01/24/22 1923)     Clinical Impression   Vertigo (Primary)       Disposition: Discharged

## 2022-01-24 NOTE — ED Triage Notes (Signed)
Dizziness since last night and being off balanced this am. Sent from [facility] for [facility] of the head. Frontal headache and states swelling to the right side of the face.

## 2022-01-24 NOTE — ED Nurses Note (Signed)
Patient to EDM via wheelchair with daughter. Patient states last night around 2030 she was standing up beside her bed and got really dizzy then got a headache. Patient states she checked her BP and glucose and both were fine. She states she took some Tylenol and went to bed. Patient states this morning she was still dizzy and had a slight headache so she went to [facility]. She states she was advised to come here for a possible head CT. Patient states she has had blurry vision, headache and dizziness today. Patient states current pain is 2/10. Patient placed on bedside monitoring. Assessment as documented. Call bell in reach.

## 2022-01-25 LAB — ECG 12 LEAD
Calculated T Axis: 28 degrees
QT Interval: 400 ms

## 2022-01-25 LAB — GOLD TOP TUBE

## 2022-04-08 ENCOUNTER — Other Ambulatory Visit (HOSPITAL_COMMUNITY): Payer: Self-pay | Admitting: Internal Medicine

## 2022-04-08 DIAGNOSIS — I10 Essential (primary) hypertension: Secondary | ICD-10-CM

## 2022-04-08 DIAGNOSIS — R0609 Other forms of dyspnea: Secondary | ICD-10-CM

## 2022-04-08 DIAGNOSIS — E785 Hyperlipidemia, unspecified: Secondary | ICD-10-CM

## 2022-04-08 DIAGNOSIS — R9431 Abnormal electrocardiogram [ECG] [EKG]: Secondary | ICD-10-CM

## 2022-04-22 ENCOUNTER — Other Ambulatory Visit (HOSPITAL_COMMUNITY): Payer: Self-pay | Admitting: Internal Medicine

## 2022-04-22 ENCOUNTER — Other Ambulatory Visit: Payer: Self-pay

## 2022-04-22 ENCOUNTER — Inpatient Hospital Stay
Admission: RE | Admit: 2022-04-22 | Discharge: 2022-04-22 | Disposition: A | Discharge status: Home / Self Care | Payer: Medicare Other | Source: Ambulatory Visit | Attending: Internal Medicine | Admitting: Internal Medicine

## 2022-04-22 ENCOUNTER — Inpatient Hospital Stay (HOSPITAL_COMMUNITY)
Admission: RE | Admit: 2022-04-22 | Discharge: 2022-04-22 | Disposition: A | Discharge status: Home / Self Care | Payer: Medicare Other | Source: Ambulatory Visit | Attending: Internal Medicine | Admitting: Internal Medicine

## 2022-04-22 DIAGNOSIS — E785 Hyperlipidemia, unspecified: Secondary | ICD-10-CM

## 2022-04-22 DIAGNOSIS — R9431 Abnormal electrocardiogram [ECG] [EKG]: Secondary | ICD-10-CM

## 2022-04-22 DIAGNOSIS — R0609 Other forms of dyspnea: Secondary | ICD-10-CM | POA: Insufficient documentation

## 2022-04-22 DIAGNOSIS — I10 Essential (primary) hypertension: Secondary | ICD-10-CM | POA: Insufficient documentation

## 2022-04-22 MED ORDER — REGADENOSON 0.4 MG/5 ML INTRAVENOUS SYRINGE
0.4000 mg | INJECTION | Freq: Once | INTRAVENOUS | Status: AC
  Administered 2022-04-22: 0.4 mg via INTRAVENOUS
  Filled 2022-04-22: qty 5

## 2022-04-23 ENCOUNTER — Ambulatory Visit (HOSPITAL_COMMUNITY): Payer: Self-pay

## 2022-04-23 ENCOUNTER — Other Ambulatory Visit (HOSPITAL_COMMUNITY): Payer: Medicare Other

## 2022-09-30 ENCOUNTER — Other Ambulatory Visit: Payer: Self-pay

## 2022-09-30 ENCOUNTER — Other Ambulatory Visit (HOSPITAL_COMMUNITY): Payer: Medicare Other

## 2022-09-30 ENCOUNTER — Inpatient Hospital Stay
Admission: RE | Admit: 2022-09-30 | Discharge: 2022-09-30 | Disposition: A | Discharge status: Home / Self Care | Payer: Medicare Other | Source: Ambulatory Visit | Attending: Family Medicine | Admitting: Family Medicine

## 2022-09-30 ENCOUNTER — Other Ambulatory Visit (HOSPITAL_COMMUNITY): Payer: Self-pay | Admitting: Family Medicine

## 2022-09-30 DIAGNOSIS — M545 Low back pain, unspecified: Secondary | ICD-10-CM | POA: Insufficient documentation

## 2022-10-12 ENCOUNTER — Ambulatory Visit (HOSPITAL_COMMUNITY): Payer: Self-pay

## 2023-08-17 ENCOUNTER — Other Ambulatory Visit (HOSPITAL_COMMUNITY): Payer: Self-pay | Admitting: Family Medicine

## 2023-08-17 DIAGNOSIS — Z1231 Encounter for screening mammogram for malignant neoplasm of breast: Secondary | ICD-10-CM

## 2023-09-14 ENCOUNTER — Ambulatory Visit (HOSPITAL_COMMUNITY)

## 2023-10-24 ENCOUNTER — Other Ambulatory Visit: Payer: Self-pay

## 2023-10-24 ENCOUNTER — Encounter (HOSPITAL_COMMUNITY): Payer: Self-pay

## 2023-10-24 ENCOUNTER — Ambulatory Visit (HOSPITAL_COMMUNITY)

## 2023-10-24 ENCOUNTER — Ambulatory Visit
Admission: RE | Admit: 2023-10-24 | Discharge: 2023-10-24 | Disposition: A | Discharge status: Home / Self Care | Source: Ambulatory Visit | Attending: Family Medicine | Admitting: Family Medicine

## 2023-10-24 DIAGNOSIS — Z1231 Encounter for screening mammogram for malignant neoplasm of breast: Secondary | ICD-10-CM | POA: Insufficient documentation

## 2023-10-25 ENCOUNTER — Encounter (INDEPENDENT_AMBULATORY_CARE_PROVIDER_SITE_OTHER): Payer: Self-pay
